# Patient Record
Sex: Female | Born: 1950 | Race: White | Hispanic: No | Marital: Married | Smoking: Never smoker
Health system: Southern US, Community
[De-identification: ages and names within clinical notes are randomized; demographics above are authoritative.]

## PROBLEM LIST (undated history)

## (undated) DIAGNOSIS — R519 Headache, unspecified: Secondary | ICD-10-CM

## (undated) DIAGNOSIS — F419 Anxiety disorder, unspecified: Secondary | ICD-10-CM

## (undated) DIAGNOSIS — E039 Hypothyroidism, unspecified: Secondary | ICD-10-CM

## (undated) DIAGNOSIS — M1990 Unspecified osteoarthritis, unspecified site: Secondary | ICD-10-CM

## (undated) DIAGNOSIS — F32A Depression, unspecified: Secondary | ICD-10-CM

## (undated) DIAGNOSIS — Z889 Allergy status to unspecified drugs, medicaments and biological substances status: Secondary | ICD-10-CM

## (undated) DIAGNOSIS — H25019 Cortical age-related cataract, unspecified eye: Secondary | ICD-10-CM

## (undated) DIAGNOSIS — B029 Zoster without complications: Secondary | ICD-10-CM

## (undated) DIAGNOSIS — E785 Hyperlipidemia, unspecified: Secondary | ICD-10-CM

## (undated) DIAGNOSIS — I10 Essential (primary) hypertension: Secondary | ICD-10-CM

## (undated) HISTORY — PX: EYE SURGERY: SHX253

## (undated) HISTORY — PX: ABDOMINAL HYSTERECTOMY: SHX81

## (undated) HISTORY — PX: TONSILLECTOMY: SUR1361

---

## 2014-02-22 ENCOUNTER — Ambulatory Visit: Payer: Self-pay | Admitting: Internal Medicine

## 2014-11-16 ENCOUNTER — Other Ambulatory Visit: Payer: Self-pay | Admitting: Internal Medicine

## 2014-11-16 DIAGNOSIS — Z1231 Encounter for screening mammogram for malignant neoplasm of breast: Secondary | ICD-10-CM

## 2015-02-25 ENCOUNTER — Ambulatory Visit
Admission: RE | Admit: 2015-02-25 | Discharge: 2015-02-25 | Disposition: A | Discharge status: Home / Self Care | Payer: No Typology Code available for payment source | Source: Ambulatory Visit | Attending: Internal Medicine | Admitting: Internal Medicine

## 2015-02-25 DIAGNOSIS — Z1231 Encounter for screening mammogram for malignant neoplasm of breast: Secondary | ICD-10-CM | POA: Diagnosis present

## 2015-11-05 ENCOUNTER — Other Ambulatory Visit: Payer: Self-pay | Admitting: Internal Medicine

## 2015-11-05 DIAGNOSIS — M5116 Intervertebral disc disorders with radiculopathy, lumbar region: Secondary | ICD-10-CM

## 2015-11-07 ENCOUNTER — Other Ambulatory Visit: Payer: Self-pay | Admitting: Internal Medicine

## 2015-11-07 DIAGNOSIS — Z1231 Encounter for screening mammogram for malignant neoplasm of breast: Secondary | ICD-10-CM

## 2015-11-26 ENCOUNTER — Ambulatory Visit: Payer: BLUE CROSS/BLUE SHIELD

## 2016-02-26 ENCOUNTER — Other Ambulatory Visit: Payer: Self-pay | Admitting: Internal Medicine

## 2016-02-26 ENCOUNTER — Ambulatory Visit
Admission: RE | Admit: 2016-02-26 | Discharge: 2016-02-26 | Disposition: A | Discharge status: Home / Self Care | Payer: BLUE CROSS/BLUE SHIELD | Source: Ambulatory Visit | Attending: Internal Medicine | Admitting: Internal Medicine

## 2016-02-26 DIAGNOSIS — Z1231 Encounter for screening mammogram for malignant neoplasm of breast: Secondary | ICD-10-CM | POA: Diagnosis present

## 2016-11-06 ENCOUNTER — Other Ambulatory Visit: Payer: Self-pay | Admitting: Internal Medicine

## 2016-11-06 DIAGNOSIS — Z1231 Encounter for screening mammogram for malignant neoplasm of breast: Secondary | ICD-10-CM

## 2017-03-04 ENCOUNTER — Ambulatory Visit
Admission: RE | Admit: 2017-03-04 | Discharge: 2017-03-04 | Disposition: A | Discharge status: Home / Self Care | Payer: BLUE CROSS/BLUE SHIELD | Source: Ambulatory Visit | Attending: Internal Medicine | Admitting: Internal Medicine

## 2017-03-04 DIAGNOSIS — Z1231 Encounter for screening mammogram for malignant neoplasm of breast: Secondary | ICD-10-CM

## 2018-01-27 ENCOUNTER — Other Ambulatory Visit: Payer: Self-pay | Admitting: Internal Medicine

## 2018-01-27 DIAGNOSIS — Z1231 Encounter for screening mammogram for malignant neoplasm of breast: Secondary | ICD-10-CM

## 2018-03-10 HISTORY — PX: JOINT REPLACEMENT: SHX530

## 2018-04-18 ENCOUNTER — Ambulatory Visit
Admission: RE | Admit: 2018-04-18 | Discharge: 2018-04-18 | Disposition: A | Discharge status: Home / Self Care | Payer: Medicare Other | Source: Ambulatory Visit | Attending: Internal Medicine | Admitting: Internal Medicine

## 2018-04-18 DIAGNOSIS — Z1231 Encounter for screening mammogram for malignant neoplasm of breast: Secondary | ICD-10-CM

## 2019-01-16 ENCOUNTER — Other Ambulatory Visit: Payer: Self-pay | Admitting: Internal Medicine

## 2019-01-16 DIAGNOSIS — Z1231 Encounter for screening mammogram for malignant neoplasm of breast: Secondary | ICD-10-CM

## 2019-04-20 ENCOUNTER — Ambulatory Visit
Admission: RE | Admit: 2019-04-20 | Discharge: 2019-04-20 | Disposition: A | Discharge status: Home / Self Care | Payer: Medicare Other | Source: Ambulatory Visit | Attending: Internal Medicine | Admitting: Internal Medicine

## 2019-04-20 DIAGNOSIS — Z1231 Encounter for screening mammogram for malignant neoplasm of breast: Secondary | ICD-10-CM | POA: Insufficient documentation

## 2019-04-24 ENCOUNTER — Other Ambulatory Visit: Payer: Self-pay | Admitting: Internal Medicine

## 2019-04-24 DIAGNOSIS — N644 Mastodynia: Secondary | ICD-10-CM

## 2019-04-27 ENCOUNTER — Ambulatory Visit
Admission: RE | Admit: 2019-04-27 | Discharge: 2019-04-27 | Disposition: A | Discharge status: Home / Self Care | Payer: Medicare Other | Source: Ambulatory Visit | Attending: Internal Medicine | Admitting: Internal Medicine

## 2019-04-27 DIAGNOSIS — N644 Mastodynia: Secondary | ICD-10-CM | POA: Diagnosis not present

## 2020-02-09 ENCOUNTER — Other Ambulatory Visit: Payer: Self-pay | Admitting: Internal Medicine

## 2020-02-09 DIAGNOSIS — Z1231 Encounter for screening mammogram for malignant neoplasm of breast: Secondary | ICD-10-CM

## 2020-05-13 ENCOUNTER — Ambulatory Visit
Admission: RE | Admit: 2020-05-13 | Discharge: 2020-05-13 | Disposition: A | Discharge status: Home / Self Care | Payer: Medicare Other | Source: Ambulatory Visit | Attending: Internal Medicine | Admitting: Internal Medicine

## 2020-05-13 ENCOUNTER — Other Ambulatory Visit: Payer: Self-pay

## 2020-05-13 DIAGNOSIS — Z1231 Encounter for screening mammogram for malignant neoplasm of breast: Secondary | ICD-10-CM | POA: Diagnosis present

## 2020-08-15 ENCOUNTER — Other Ambulatory Visit
Admission: RE | Admit: 2020-08-15 | Discharge: 2020-08-15 | Disposition: A | Discharge status: Home / Self Care | Payer: Medicare Other | Source: Ambulatory Visit | Attending: Internal Medicine | Admitting: Internal Medicine

## 2020-08-15 ENCOUNTER — Other Ambulatory Visit: Payer: Self-pay

## 2020-08-15 DIAGNOSIS — Z20822 Contact with and (suspected) exposure to covid-19: Secondary | ICD-10-CM | POA: Diagnosis not present

## 2020-08-15 DIAGNOSIS — Z01812 Encounter for preprocedural laboratory examination: Secondary | ICD-10-CM | POA: Insufficient documentation

## 2020-08-15 LAB — SARS CORONAVIRUS 2 (TAT 6-24 HRS): SARS Coronavirus 2: NEGATIVE

## 2020-08-16 ENCOUNTER — Encounter: Payer: Self-pay | Admitting: Internal Medicine

## 2020-08-19 ENCOUNTER — Encounter: Admission: RE | Payer: Self-pay | Source: Home / Self Care

## 2020-08-19 ENCOUNTER — Ambulatory Visit: Admission: RE | Admit: 2020-08-19 | Payer: Medicare Other | Source: Home / Self Care | Admitting: Internal Medicine

## 2020-08-19 HISTORY — DX: Cortical age-related cataract, unspecified eye: H25.019

## 2020-08-19 HISTORY — DX: Zoster without complications: B02.9

## 2020-08-19 HISTORY — DX: Headache, unspecified: R51.9

## 2020-08-19 HISTORY — DX: Unspecified osteoarthritis, unspecified site: M19.90

## 2020-08-19 HISTORY — DX: Hypothyroidism, unspecified: E03.9

## 2020-08-19 HISTORY — DX: Hyperlipidemia, unspecified: E78.5

## 2020-08-19 HISTORY — DX: Essential (primary) hypertension: I10

## 2020-08-19 HISTORY — DX: Allergy status to unspecified drugs, medicaments and biological substances: Z88.9

## 2020-08-19 HISTORY — DX: Anxiety disorder, unspecified: F41.9

## 2020-08-19 HISTORY — DX: Depression, unspecified: F32.A

## 2020-08-19 SURGERY — ESOPHAGOGASTRODUODENOSCOPY (EGD) WITH PROPOFOL
Anesthesia: General

## 2020-09-12 ENCOUNTER — Other Ambulatory Visit: Payer: Self-pay

## 2020-09-12 ENCOUNTER — Other Ambulatory Visit
Admission: RE | Admit: 2020-09-12 | Discharge: 2020-09-12 | Disposition: A | Discharge status: Home / Self Care | Payer: Medicare Other | Source: Ambulatory Visit | Attending: Internal Medicine | Admitting: Internal Medicine

## 2020-09-12 DIAGNOSIS — Z01812 Encounter for preprocedural laboratory examination: Secondary | ICD-10-CM | POA: Diagnosis present

## 2020-09-12 DIAGNOSIS — Z20822 Contact with and (suspected) exposure to covid-19: Secondary | ICD-10-CM | POA: Diagnosis not present

## 2020-09-13 ENCOUNTER — Encounter: Payer: Self-pay | Admitting: Internal Medicine

## 2020-09-13 LAB — SARS CORONAVIRUS 2 (TAT 6-24 HRS): SARS Coronavirus 2: NEGATIVE

## 2020-09-16 ENCOUNTER — Ambulatory Visit: Payer: Medicare Other | Admitting: Certified Registered Nurse Anesthetist

## 2020-09-16 ENCOUNTER — Encounter: Payer: Self-pay | Admitting: Internal Medicine

## 2020-09-16 ENCOUNTER — Encounter
Admission: RE | Disposition: A | Discharge status: Home / Self Care | Payer: Self-pay | Source: Home / Self Care | Attending: Internal Medicine

## 2020-09-16 ENCOUNTER — Ambulatory Visit
Admission: RE | Admit: 2020-09-16 | Discharge: 2020-09-16 | Disposition: A | Discharge status: Home / Self Care | Payer: Medicare Other | Attending: Internal Medicine | Admitting: Internal Medicine

## 2020-09-16 DIAGNOSIS — Z79899 Other long term (current) drug therapy: Secondary | ICD-10-CM | POA: Diagnosis not present

## 2020-09-16 DIAGNOSIS — Z79890 Hormone replacement therapy (postmenopausal): Secondary | ICD-10-CM | POA: Diagnosis not present

## 2020-09-16 DIAGNOSIS — E785 Hyperlipidemia, unspecified: Secondary | ICD-10-CM | POA: Insufficient documentation

## 2020-09-16 DIAGNOSIS — Z882 Allergy status to sulfonamides status: Secondary | ICD-10-CM | POA: Diagnosis not present

## 2020-09-16 DIAGNOSIS — Z1211 Encounter for screening for malignant neoplasm of colon: Secondary | ICD-10-CM | POA: Diagnosis not present

## 2020-09-16 DIAGNOSIS — I10 Essential (primary) hypertension: Secondary | ICD-10-CM | POA: Insufficient documentation

## 2020-09-16 DIAGNOSIS — K219 Gastro-esophageal reflux disease without esophagitis: Secondary | ICD-10-CM | POA: Diagnosis not present

## 2020-09-16 DIAGNOSIS — K2289 Other specified disease of esophagus: Secondary | ICD-10-CM | POA: Diagnosis not present

## 2020-09-16 DIAGNOSIS — K640 First degree hemorrhoids: Secondary | ICD-10-CM | POA: Diagnosis not present

## 2020-09-16 DIAGNOSIS — K449 Diaphragmatic hernia without obstruction or gangrene: Secondary | ICD-10-CM | POA: Insufficient documentation

## 2020-09-16 DIAGNOSIS — D649 Anemia, unspecified: Secondary | ICD-10-CM | POA: Diagnosis not present

## 2020-09-16 HISTORY — PX: COLONOSCOPY WITH PROPOFOL: SHX5780

## 2020-09-16 HISTORY — PX: ESOPHAGOGASTRODUODENOSCOPY (EGD) WITH PROPOFOL: SHX5813

## 2020-09-16 SURGERY — COLONOSCOPY WITH PROPOFOL
Anesthesia: General

## 2020-09-16 MED ORDER — SODIUM CHLORIDE 0.9 % IV SOLN
INTRAVENOUS | Status: DC
  Administered 2020-09-16: 1000 mL via INTRAVENOUS

## 2020-09-16 MED ORDER — PROPOFOL 500 MG/50ML IV EMUL
INTRAVENOUS | Status: DC | PRN
  Administered 2020-09-16: 160 ug/kg/min via INTRAVENOUS

## 2020-09-16 MED ORDER — FENTANYL CITRATE (PF) 100 MCG/2ML IJ SOLN
INTRAMUSCULAR | Status: AC
  Filled 2020-09-16: qty 2

## 2020-09-16 MED ORDER — MIDAZOLAM HCL 2 MG/2ML IJ SOLN
INTRAMUSCULAR | Status: AC
  Filled 2020-09-16: qty 2

## 2020-09-16 MED ORDER — LIDOCAINE HCL (CARDIAC) PF 100 MG/5ML IV SOSY
PREFILLED_SYRINGE | INTRAVENOUS | Status: DC | PRN
  Administered 2020-09-16: 100 mg via INTRAVENOUS

## 2020-09-16 MED ORDER — PROPOFOL 10 MG/ML IV BOLUS
INTRAVENOUS | Status: DC | PRN
  Administered 2020-09-16 (×2): 30 mg via INTRAVENOUS
  Administered 2020-09-16: 70 mg via INTRAVENOUS
  Administered 2020-09-16: 10 mg via INTRAVENOUS
  Administered 2020-09-16: 20 mg via INTRAVENOUS
  Administered 2020-09-16 (×2): 10 mg via INTRAVENOUS

## 2020-09-16 MED ORDER — FENTANYL CITRATE (PF) 100 MCG/2ML IJ SOLN
INTRAMUSCULAR | Status: DC | PRN
  Administered 2020-09-16: 50 ug via INTRAVENOUS

## 2020-09-16 MED ORDER — MIDAZOLAM HCL 2 MG/2ML IJ SOLN
INTRAMUSCULAR | Status: DC | PRN
  Administered 2020-09-16: 2 mg via INTRAVENOUS

## 2020-09-16 NOTE — Anesthesia Postprocedure Evaluation (Signed)
Anesthesia Post Note  Patient: STORMI VANDEVELDE  Procedure(s) Performed: COLONOSCOPY WITH PROPOFOL (N/A ) ESOPHAGOGASTRODUODENOSCOPY (EGD) WITH PROPOFOL (N/A )  Patient location during evaluation: Endoscopy Anesthesia Type: General Level of consciousness: awake and alert Pain management: pain level controlled Vital Signs Assessment: post-procedure vital signs reviewed and stable Respiratory status: spontaneous breathing, nonlabored ventilation, respiratory function stable and patient connected to nasal cannula oxygen Cardiovascular status: blood pressure returned to baseline and stable Postop Assessment: no apparent nausea or vomiting Anesthetic complications: no   No complications documented.   Last Vitals:  Vitals:   09/16/20 1251 09/16/20 1348  BP: 132/73 113/66  Pulse: 95 88  Resp: 18 12  Temp: 37 C (!) 36.4 C  SpO2: 100% 100%    Last Pain:  Vitals:   09/16/20 1348  TempSrc: Temporal  PainSc: 0-No pain                 Corinda Gubler

## 2020-09-16 NOTE — Anesthesia Preprocedure Evaluation (Signed)
Anesthesia Evaluation  Patient identified by MRN, date of birth, ID band Patient awake    Reviewed: Allergy & Precautions, NPO status , Patient's Chart, lab work & pertinent test results  History of Anesthesia Complications Negative for: history of anesthetic complications  Airway Mallampati: II  TM Distance: >3 FB Neck ROM: Full    Dental no notable dental hx. (+) Teeth Intact   Pulmonary neg pulmonary ROS, neg sleep apnea, neg COPD, Patient abstained from smoking.Not current smoker,    Pulmonary exam normal breath sounds clear to auscultation       Cardiovascular Exercise Tolerance: Good METShypertension, (-) CAD and (-) Past MI (-) dysrhythmias  Rhythm:Regular Rate:Normal - Systolic murmurs    Neuro/Psych  Headaches, PSYCHIATRIC DISORDERS Anxiety Depression    GI/Hepatic neg GERD  ,(+)     (-) substance abuse  ,   Endo/Other  neg diabetesHypothyroidism   Renal/GU negative Renal ROS     Musculoskeletal   Abdominal   Peds  Hematology   Anesthesia Other Findings Past Medical History: No date: Allergic genetic state No date: Anxiety No date: Arthritis No date: Cataract cortical, senile No date: Depression     Comment:  MAJOR DEPRESSIVE DISORDER,RECURRENT,MILD No date: Headache     Comment:  MIGRAINE No date: Hyperlipidemia No date: Hypertension No date: Hypothyroidism No date: Zoster  Reproductive/Obstetrics                             Anesthesia Physical Anesthesia Plan  ASA: II  Anesthesia Plan: General   Post-op Pain Management:    Induction: Intravenous  PONV Risk Score and Plan: 3 and Ondansetron, Propofol infusion and TIVA  Airway Management Planned: Nasal Cannula  Additional Equipment: None  Intra-op Plan:   Post-operative Plan:   Informed Consent: I have reviewed the patients History and Physical, chart, labs and discussed the procedure including the risks,  benefits and alternatives for the proposed anesthesia with the patient or authorized representative who has indicated his/her understanding and acceptance.     Dental advisory given  Plan Discussed with: CRNA and Surgeon  Anesthesia Plan Comments: (Discussed risks of anesthesia with patient, including possibility of difficulty with spontaneous ventilation under anesthesia necessitating airway intervention, PONV, and rare risks such as cardiac or respiratory or neurological events. Patient understands.)        Anesthesia Quick Evaluation

## 2020-09-16 NOTE — Interval H&P Note (Signed)
History and Physical Interval Note:  09/16/2020 12:44 PM  Jenna Sanford  has presented today for surgery, with the diagnosis of CHRONIC ANEMIA.  The various methods of treatment have been discussed with the patient and family. After consideration of risks, benefits and other options for treatment, the patient has consented to  Procedure(s): COLONOSCOPY WITH PROPOFOL (N/A) ESOPHAGOGASTRODUODENOSCOPY (EGD) WITH PROPOFOL (N/A) as a surgical intervention.  The patient's history has been reviewed, patient examined, no change in status, stable for surgery.  I have reviewed the patient's chart and labs.  Questions were answered to the patient's satisfaction.     [place], [place]

## 2020-09-16 NOTE — Op Note (Signed)
[facility]astroenterology Patient Name: Coletta Lockner Procedure Date: 09/16/2020 1:18 PM MRN: [id number] Account #: [id number] Date of Birth: 16-Apr-1951 Admit Type: Outpatient Age: 70 Room: [facility] ENDO ROOM 2 Gender: Female Note Status: Finalized Procedure:             Upper GI endoscopy Indications:           Follow-up of esophageal reflux Providers:             Boykin Nearing. Hodan Wurtz MD, MD Medicines:             Propofol per Anesthesia Complications:         No immediate complications. Procedure:             Pre-Anesthesia Assessment:                        - The risks and benefits of the procedure and the                         sedation options and risks were discussed with the                         patient. All questions were answered and informed                         consent was obtained.                        - Patient identification and proposed procedure were                         verified prior to the procedure by the nurse. The                         procedure was verified in the procedure room.                        - ASA Grade Assessment: III - A patient with severe                         systemic disease.                        - After reviewing the risks and benefits, the patient                         was deemed in satisfactory condition to undergo the                         procedure.                        After obtaining informed consent, the endoscope was                         passed under direct vision. Throughout the procedure,                         the patient's blood pressure, pulse, and oxygen  saturations were monitored continuously. The Endoscope                         was introduced through the mouth, and advanced to the                         third part of duodenum. The upper GI endoscopy was                         accomplished without difficulty. The patient tolerated                          the procedure well. Findings:      The Z-line was irregular and was found at the gastroesophageal junction.       Mucosa was biopsied with a cold forceps for histology. One specimen       bottle was sent to pathology.      A 2 cm hiatal hernia was present.      The examined duodenum was normal.      The exam was otherwise without abnormality. Impression:            - Z-line irregular, at the gastroesophageal junction.                         Biopsied.                        - 2 cm hiatal hernia.                        - Normal examined duodenum.                        - The examination was otherwise normal. Recommendation:        - Await pathology results.                        - Proceed with colonoscopy Procedure Code(s):     --- Professional ---                        [phone], Esophagogastroduodenoscopy, flexible,                         transoral; with biopsy, single or multiple Diagnosis Code(s):     --- Professional ---                        K22.8, Other specified diseases of esophagus                        K44.9, Diaphragmatic hernia without obstruction or                         gangrene                        K21.9, Gastro-esophageal reflux disease without                         esophagitis CPT copyright 2019 American Medical Association. All rights reserved. The codes documented in this report  are preliminary and upon coder review may  be revised to meet current compliance requirements. Stanton Kidney MD, MD 09/16/2020 1:31:00 PM This report has been signed electronically. Number of Addenda: 0 Note Initiated On: 09/16/2020 1:18 PM Estimated Blood Loss:  Estimated blood loss: none.      [facility]

## 2020-09-16 NOTE — H&P (Signed)
Outpatient short stay form Pre-procedure 09/16/2020 11:05 AM Jenna Sanford K. Jenna Sanford, M.D.  Primary Physician: Jenna Sanford, M.D.  Reason for visit:  Anemia (NOS), Colon cancer screening  History of present illness:  Patient presents for colonoscopy for colon cancer screening. The patient denies complaints of abdominal pain, significant change in bowel habits, or rectal bleeding.  Patient anemic without active UGI symptoms. Has hx of GERD, however, previously on PPI.   No current facility-administered medications for this encounter.  Current Outpatient Medications:  .  amitriptyline (ELAVIL) 50 MG tablet, Take 50 mg by mouth at bedtime., Disp: , Rfl:  .  bumetanide (BUMEX) 1 MG tablet, Take 1 mg by mouth daily., Disp: , Rfl:  .  Cholecalciferol 125 MCG (5000 UT) TABS, Take 5,000 Units by mouth daily at 2 am., Disp: , Rfl:  .  estradiol (ESTRACE) 0.5 MG tablet, Take 0.5 mg by mouth daily., Disp: , Rfl:  .  ferrous gluconate (FERGON) 324 MG tablet, Take 324 mg by mouth daily with breakfast., Disp: , Rfl:  .  fluticasone (FLONASE) 50 MCG/ACT nasal spray, Place 2 sprays into both nostrils daily., Disp: , Rfl:  .  levothyroxine (SYNTHROID) 75 MCG tablet, Take 75 mcg by mouth daily before breakfast., Disp: , Rfl:  .  LORazepam (ATIVAN) 0.5 MG tablet, Take 0.5 mg by mouth every 8 (eight) hours., Disp: , Rfl:  .  magnesium oxide (MAG-OX) 400 MG tablet, Take 400 mg by mouth daily., Disp: , Rfl:  .  omeprazole (PRILOSEC) 40 MG capsule, Take 40 mg by mouth daily., Disp: , Rfl:  .  PARoxetine (PAXIL) 20 MG tablet, Take 20 mg by mouth daily., Disp: , Rfl:  .  simvastatin (ZOCOR) 10 MG tablet, Take 10 mg by mouth daily., Disp: , Rfl:  .  triamterene-hydrochlorothiazide (DYAZIDE) 37.5-25 MG capsule, Take 1 capsule by mouth daily., Disp: , Rfl:  .  vitamin B-12 (CYANOCOBALAMIN) 1000 MCG tablet, Take 1,000 mcg by mouth daily., Disp: , Rfl:   No medications prior to admission.     Allergies  Allergen  Reactions  . Sulfa Antibiotics Hives     Past Medical History:  Diagnosis Date  . Allergic genetic state   . Anxiety   . Arthritis   . Cataract cortical, senile   . Depression    MAJOR DEPRESSIVE DISORDER,RECURRENT,MILD  . Headache    MIGRAINE  . Hyperlipidemia   . Hypertension   . Hypothyroidism   . Zoster     Review of systems:  Otherwise negative.    Physical Exam  Gen: Alert, oriented. Appears stated age.  HEENT: [place]/AT. PERRLA. Lungs: CTA, no wheezes. CV: RR nl S1, S2. Abd: soft, benign, no masses. BS+ Ext: No edema. Pulses 2+    Planned procedures: Proceed with EGD and colonoscopy. The patient understands the nature of the planned procedure, indications, risks, alternatives and potential complications including but not limited to bleeding, infection, perforation, damage to internal organs and possible oversedation/side effects from anesthesia. The patient agrees and gives consent to proceed.  Please refer to procedure notes for findings, recommendations and patient disposition/instructions.     Jenna Sanford K. Jenna Sanford, M.D. Gastroenterology 09/16/2020  11:05 AM

## 2020-09-16 NOTE — Transfer of Care (Signed)
Immediate Anesthesia Transfer of Care Note  Patient: Jenna Sanford  Procedure(s) Performed: COLONOSCOPY WITH PROPOFOL (N/A ) ESOPHAGOGASTRODUODENOSCOPY (EGD) WITH PROPOFOL (N/A )  Patient Location: PACU  Anesthesia Type:General  Level of Consciousness: awake and alert   Airway & Oxygen Therapy: Patient Spontanous Breathing and Patient connected to nasal cannula oxygen  Post-op Assessment: Report given to RN and Post -op Vital signs reviewed and stable  Post vital signs: Reviewed and stable  Last Vitals:  Vitals Value Taken Time  BP 113/66 09/16/20 1348  Temp    Pulse 88 09/16/20 1349  Resp 16 09/16/20 1349  SpO2 100 % 09/16/20 1349  Vitals shown include unvalidated device data.  Last Pain:  Vitals:   09/16/20 1251  PainSc: 0-No pain      Patients Stated Pain Goal: 0 (09/16/20 1251)  Complications: No complications documented.

## 2020-09-16 NOTE — Op Note (Addendum)
[facility] Gastroenterology Patient Name: Jenna Sanford Procedure Date: 09/16/2020 1:18 PM MRN: [id number] Account #: [id number] Date of Birth: 01-02-1951 Admit Type: Outpatient Age: 70 Room: [facility] ENDO ROOM 2 Gender: Female Note Status: Finalized Procedure:             Colonoscopy Indications:           Screening for colorectal malignant neoplasm Providers:             Boykin Nearing. [place] MD, MD Medicines:             Propofol per Anesthesia Complications:         No immediate complications. Procedure:             Pre-Anesthesia Assessment:                        - The risks and benefits of the procedure and the                         sedation options and risks were discussed with the                         patient. All questions were answered and informed                         consent was obtained.                        - Patient identification and proposed procedure were                         verified prior to the procedure by the nurse. The                         procedure was verified in the procedure room.                        - ASA Grade Assessment: III - A patient with severe                         systemic disease.                        - After reviewing the risks and benefits, the patient                         was deemed in satisfactory condition to undergo the                         procedure.                        After obtaining informed consent, the colonoscope was                         passed under direct vision. Throughout the procedure,                         the patient's blood pressure, pulse, and oxygen  saturations were monitored continuously. The                         Colonoscope was introduced through the anus and                         advanced to the the cecum, identified by appendiceal                         orifice and ileocecal valve. The colonoscopy was                         performed  without difficulty. The patient tolerated                         the procedure well. The quality of the bowel                         preparation was good. The ileocecal valve, appendiceal                         orifice, and rectum were photographed. Findings:      The perianal and digital rectal examinations were normal. Pertinent       negatives include normal sphincter tone and no palpable rectal lesions.      Non-bleeding internal hemorrhoids were found during retroflexion. The       hemorrhoids were Grade I (internal hemorrhoids that do not prolapse).      A 4 mm polyp was found in the distal sigmoid colon. The polyp was       sessile. The polyp was removed with a jumbo cold forceps. Resection and       retrieval were complete.      The exam was otherwise without abnormality. Impression:            - Non-bleeding internal hemorrhoids.                        - One 4 mm polyp in the distal sigmoid colon, removed                         with a jumbo cold forceps. Resected and retrieved.                        - The examination was otherwise normal. Recommendation:        - Patient has a contact number available for                         emergencies. The signs and symptoms of potential                         delayed complications were discussed with the patient.                         Return to normal activities tomorrow. Written                         discharge instructions were provided to the patient.                        -  Resume previous diet.                        - Continue present medications.                        - Return to physician assistant in 3 months.                        - Follow up with Tawni Pummel, PA-C at [facility]. [phone].                        - The findings and recommendations were discussed with                         the patient.                        - Repeat colonoscopy after studies are  complete for                         surveillance based on pathology results.                        - Await pathology results from EGD, also performed                         today.                        - The findings and recommendations were discussed with                         the patient. Procedure Code(s):     --- Professional ---                        [phone], Colonoscopy, flexible; with biopsy, single or                         multiple Diagnosis Code(s):     --- Professional ---                        K64.0, First degree hemorrhoids                        K63.5, Polyp of colon                        Z12.11, Encounter for screening for malignant neoplasm                         of colon CPT copyright 2019 American Medical Association. All rights reserved. The codes documented in this report are preliminary and upon coder review may  be revised to meet current compliance requirements. Stanton Kidney MD, MD 09/16/2020 1:49:01 PM This report has been signed electronically. Number of Addenda: 0 Note Initiated On: 09/16/2020 1:18 PM Scope Withdrawal Time: 0 hours 6 minutes 45 seconds  Total  Procedure Duration: 0 hours 11 minutes 26 seconds  Estimated Blood Loss:  Estimated blood loss: none. Estimated blood loss: none.      [facility]

## 2020-09-18 LAB — SURGICAL PATHOLOGY

## 2021-02-07 ENCOUNTER — Other Ambulatory Visit: Payer: Self-pay | Admitting: Internal Medicine

## 2021-02-07 DIAGNOSIS — Z1231 Encounter for screening mammogram for malignant neoplasm of breast: Secondary | ICD-10-CM

## 2021-05-14 ENCOUNTER — Other Ambulatory Visit: Payer: Self-pay

## 2021-05-14 ENCOUNTER — Ambulatory Visit
Admission: RE | Admit: 2021-05-14 | Discharge: 2021-05-14 | Disposition: A | Discharge status: Home / Self Care | Payer: Medicare Other | Source: Ambulatory Visit | Attending: Internal Medicine | Admitting: Internal Medicine

## 2021-05-14 DIAGNOSIS — Z1231 Encounter for screening mammogram for malignant neoplasm of breast: Secondary | ICD-10-CM

## 2022-02-20 ENCOUNTER — Other Ambulatory Visit: Payer: Self-pay | Admitting: Internal Medicine

## 2022-02-20 DIAGNOSIS — Z1231 Encounter for screening mammogram for malignant neoplasm of breast: Secondary | ICD-10-CM

## 2022-03-24 IMAGING — MG MM DIGITAL SCREENING BILAT W/ TOMO AND CAD
6 of 10 series · 6 of 30 positions shown · non-contrast
Comparison: Previous exam(s).

CLINICAL DATA: Screening.

EXAM:
DIGITAL SCREENING BILATERAL MAMMOGRAM WITH TOMOSYNTHESIS AND CAD
TECHNIQUE: Bilateral screening digital craniocaudal and mediolateral oblique
mammograms were obtained. Bilateral screening digital breast
tomosynthesis was performed. The images were evaluated with
computer-aided detection.

[R MLO synth-2D (1 of 2)]
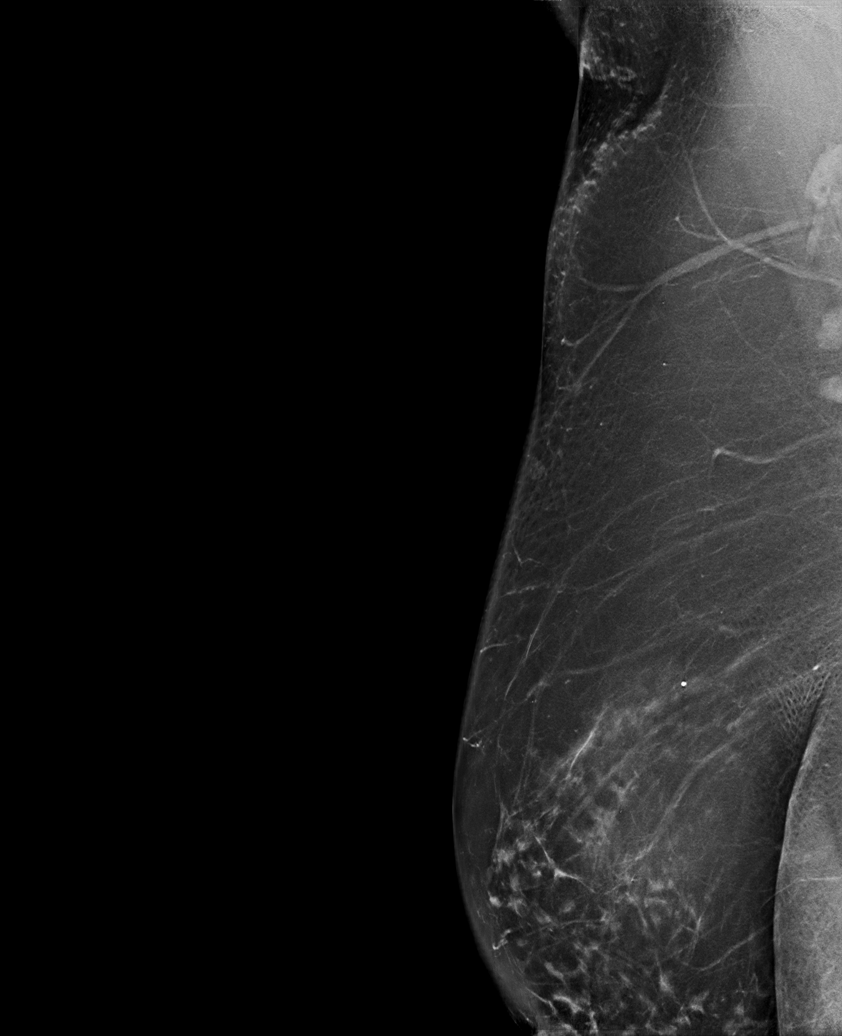

[L MLO synth-2D]
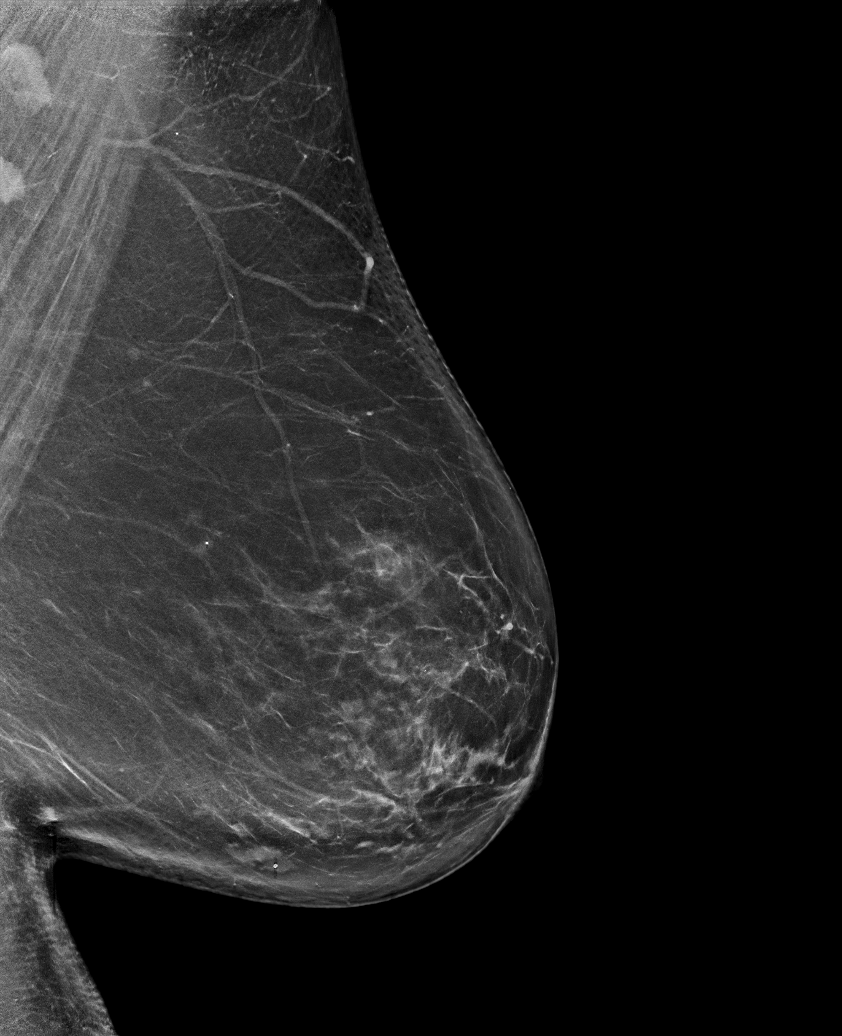

[L CC synth-2D]
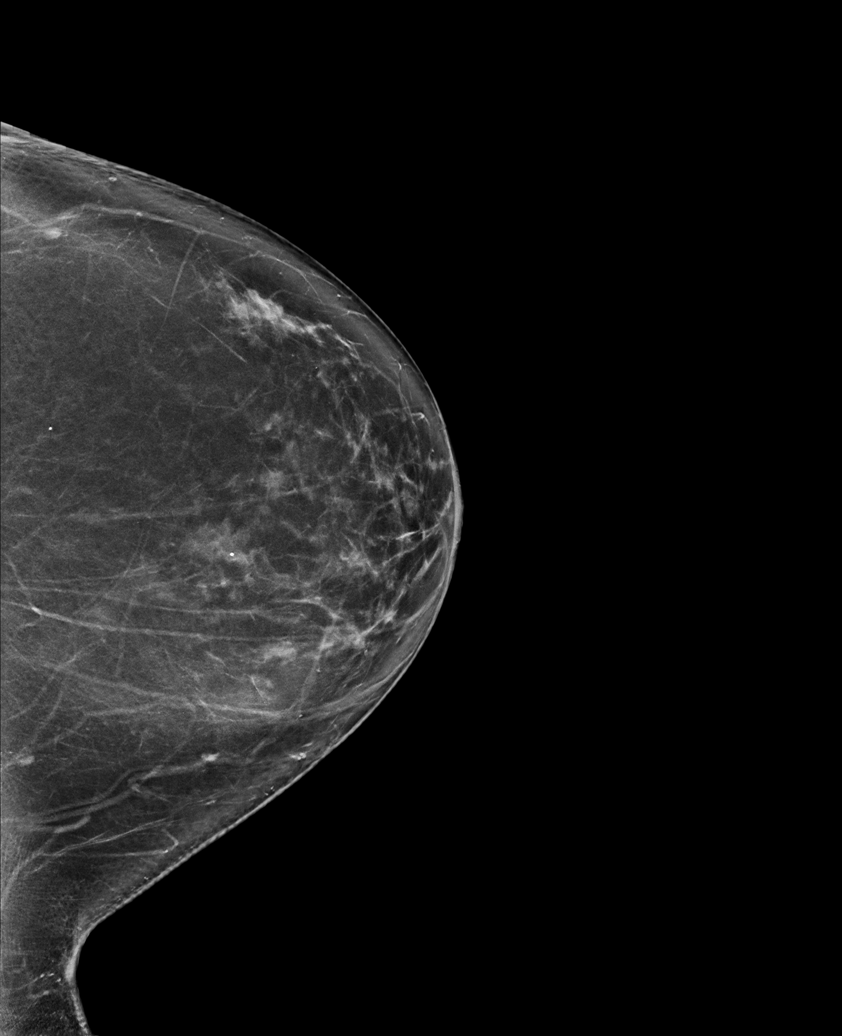

[R CC synth-2D]
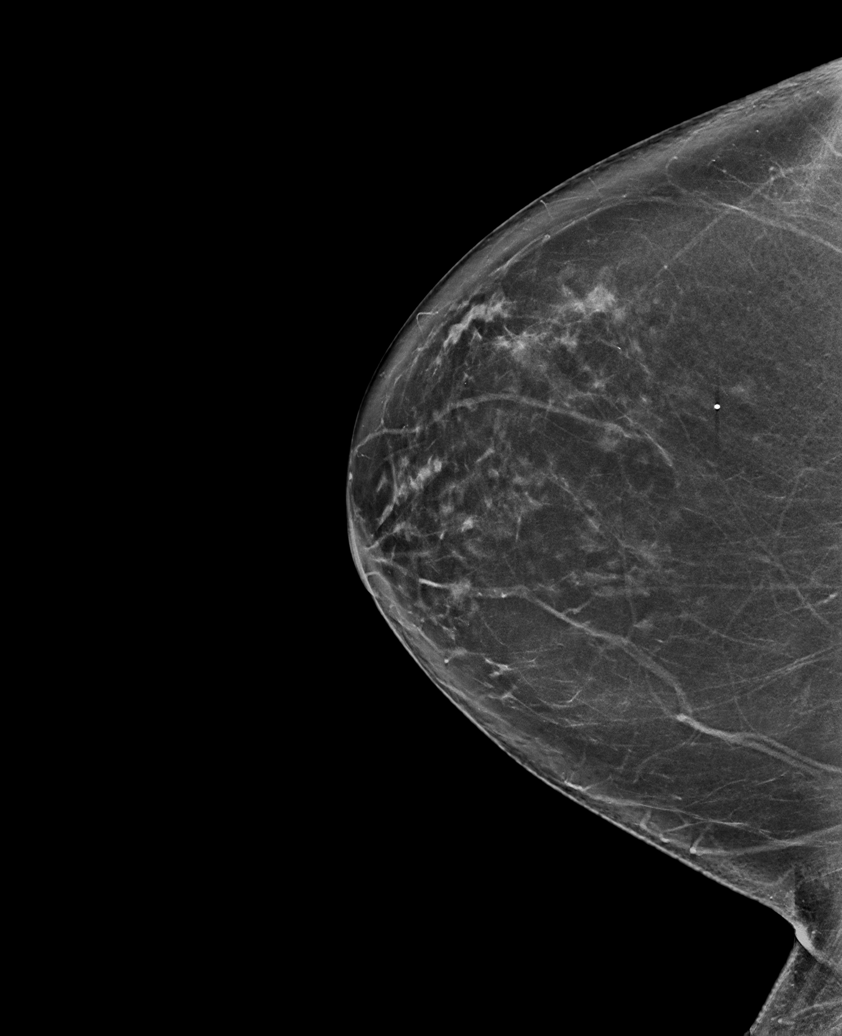

[R MLO synth-2D (2 of 2)]
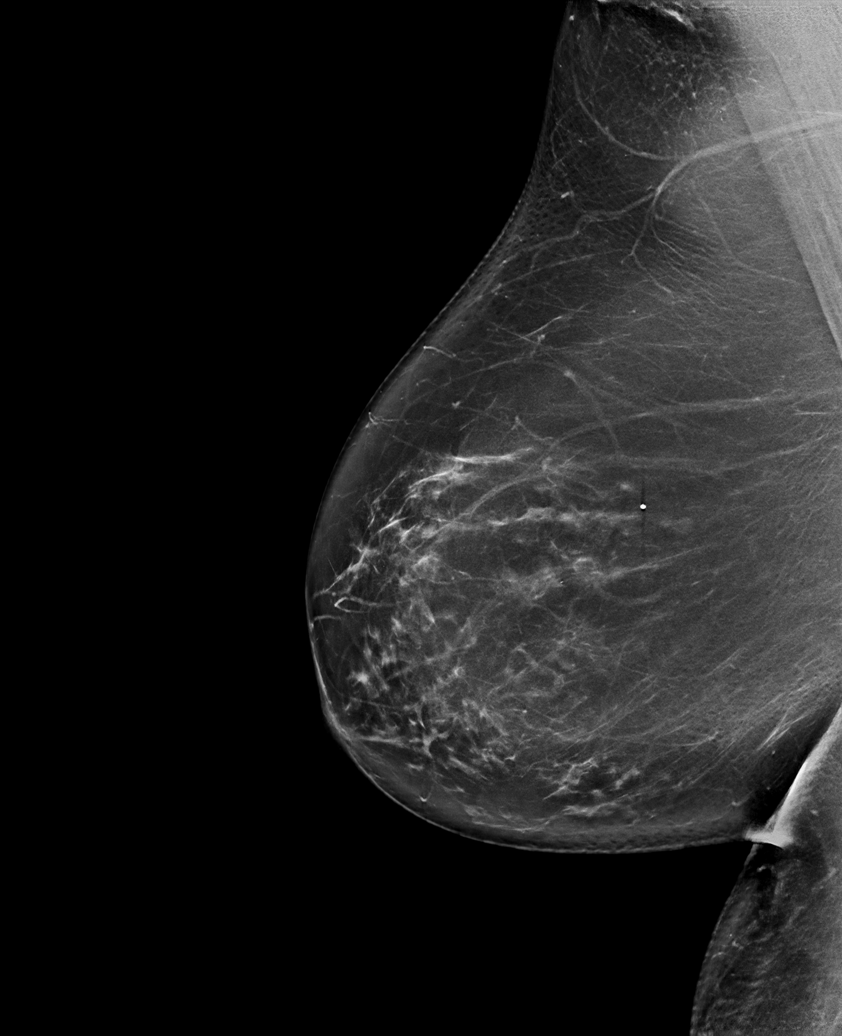

[L CC tomo · tomo slice 42/83.0]
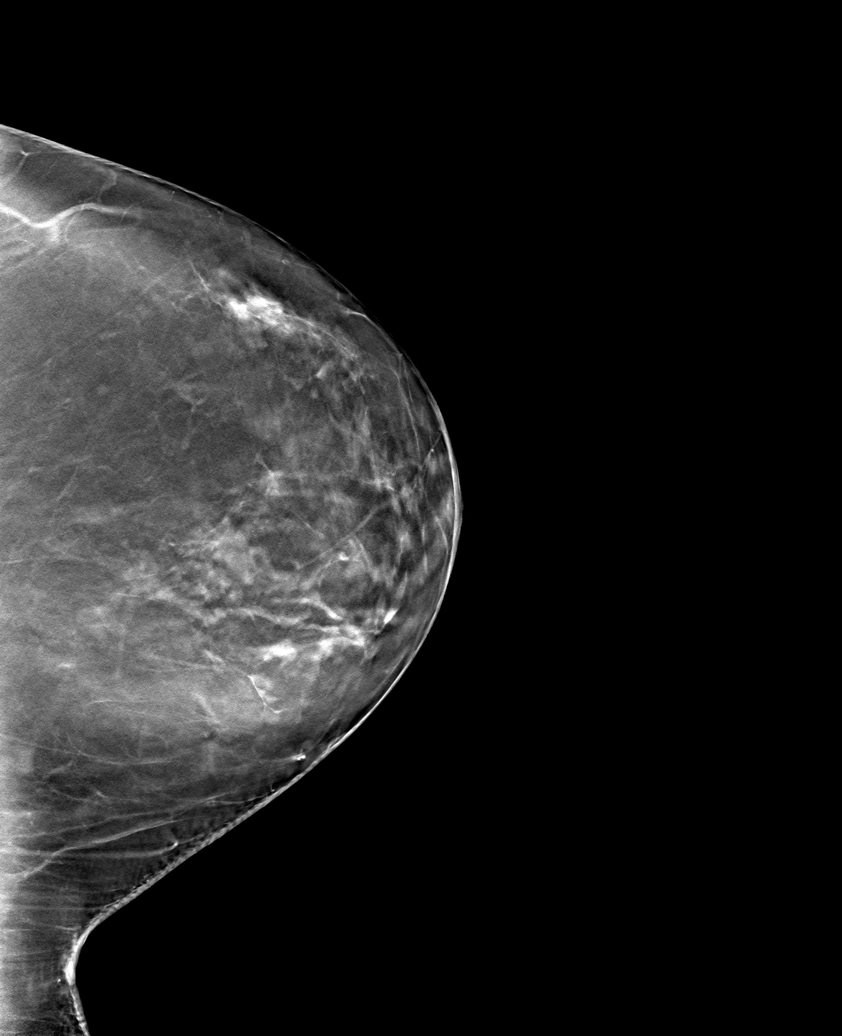

[6 of 30 positions shown; findings below may reference images not displayed]

ACR Breast Density Category b: There are scattered areas of
fibroglandular density.
FINDINGS: There are no findings suspicious for malignancy.
IMPRESSION: No mammographic evidence of malignancy. A result letter of this
screening mammogram will be mailed directly to the patient.

RECOMMENDATION:
Screening mammogram in one year. (Code:51-O-LD2)

BI-RADS CATEGORY  1: Negative.

## 2022-05-15 ENCOUNTER — Ambulatory Visit
Admission: RE | Admit: 2022-05-15 | Discharge: 2022-05-15 | Disposition: A | Discharge status: Home / Self Care | Payer: Medicare Other | Source: Ambulatory Visit | Attending: Internal Medicine | Admitting: Internal Medicine

## 2022-05-15 DIAGNOSIS — Z1231 Encounter for screening mammogram for malignant neoplasm of breast: Secondary | ICD-10-CM | POA: Diagnosis not present

## 2023-02-12 ENCOUNTER — Other Ambulatory Visit: Payer: Self-pay | Admitting: Internal Medicine

## 2023-02-12 DIAGNOSIS — Z1231 Encounter for screening mammogram for malignant neoplasm of breast: Secondary | ICD-10-CM

## 2023-05-18 ENCOUNTER — Ambulatory Visit
Admission: RE | Admit: 2023-05-18 | Discharge: 2023-05-18 | Disposition: A | Discharge status: Home / Self Care | Payer: Medicare Other | Source: Ambulatory Visit | Attending: Internal Medicine | Admitting: Internal Medicine

## 2023-05-18 DIAGNOSIS — Z1231 Encounter for screening mammogram for malignant neoplasm of breast: Secondary | ICD-10-CM | POA: Insufficient documentation

## 2024-04-17 ENCOUNTER — Other Ambulatory Visit: Payer: Self-pay | Admitting: Internal Medicine

## 2024-04-17 DIAGNOSIS — Z1231 Encounter for screening mammogram for malignant neoplasm of breast: Secondary | ICD-10-CM

## 2024-05-19 ENCOUNTER — Ambulatory Visit
Admission: RE | Admit: 2024-05-19 | Discharge: 2024-05-19 | Disposition: A | Discharge status: Home / Self Care | Source: Ambulatory Visit | Attending: Internal Medicine | Admitting: Internal Medicine

## 2024-05-19 DIAGNOSIS — Z1231 Encounter for screening mammogram for malignant neoplasm of breast: Secondary | ICD-10-CM | POA: Insufficient documentation
